# Patient Record
Sex: Male | Born: 1970 | Race: Black or African American | Hispanic: No | Smoking: Current every day smoker
Health system: Southern US, Community
[De-identification: ages and names within clinical notes are randomized; demographics above are authoritative.]

---

## 2014-06-28 ENCOUNTER — Encounter (HOSPITAL_COMMUNITY): Payer: Self-pay | Admitting: *Deleted

## 2014-06-28 ENCOUNTER — Emergency Department (HOSPITAL_COMMUNITY)
Admission: EM | Admit: 2014-06-28 | Discharge: 2014-06-28 | Disposition: A | Discharge status: Home / Self Care | Payer: Self-pay | Attending: Emergency Medicine | Admitting: Emergency Medicine

## 2014-06-28 ENCOUNTER — Emergency Department (HOSPITAL_COMMUNITY): Payer: Self-pay

## 2014-06-28 DIAGNOSIS — Z720 Tobacco use: Secondary | ICD-10-CM | POA: Insufficient documentation

## 2014-06-28 DIAGNOSIS — M79642 Pain in left hand: Secondary | ICD-10-CM | POA: Insufficient documentation

## 2014-06-28 DIAGNOSIS — M791 Myalgia: Secondary | ICD-10-CM | POA: Insufficient documentation

## 2014-06-28 DIAGNOSIS — M7918 Myalgia, other site: Secondary | ICD-10-CM

## 2014-06-28 MED ORDER — MELOXICAM 7.5 MG PO TABS: 7.5000 mg | ORAL_TABLET | Freq: Every day | ORAL | Status: DC

## 2014-06-28 MED ORDER — CYCLOBENZAPRINE HCL 10 MG PO TABS: 10.0000 mg | ORAL_TABLET | Freq: Three times a day (TID) | ORAL | Status: DC | PRN

## 2014-06-28 NOTE — ED Notes (Signed)
Pt reports pain to LT hand with out injury.

## 2014-06-28 NOTE — ED Provider Notes (Signed)
CSN: [id number]     Arrival date & time 06/28/14  [id number] History  This chart was scribed for Clinton DredgeEmily Ahrianna Siglin, PA-C, working with Richardean Canalavid H Yao, MD by Leona CarryG. Clay Sherrill, ED Scribe. [id number] The patient's care was started at 11:34 AM.     Chief Complaint  Patient presents with  . Hand Pain   Patient is a 44 y.o. male presenting with hand pain. The history is provided by the patient. No language interpreter was used.  Hand Pain   HPI Comments: [place] is a 44 y.o. male who presents to the Emergency Department complaining of sharp left hand pain beginning yesterday morning. Patient reports that the pain is localized to his pinkie, thumb, and the bottom of his palm. He states that the pain does not radiate to his arm. He characterizes the pain as an 8/10 in severity and reports that it is exacerbated by flexion  of his hand. He denies any recent trauma or injury to his hand. He reports that he has taken 6 ibuprofen in the past day without relief of his pain. Patient denies numbness or tingling in his fingers. 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 He states that he is right-handed.   Patient does not have a PCP.   History reviewed. No pertinent past medical history. History reviewed. No pertinent past surgical history. History reviewed. No pertinent family history. History  Substance Use Topics  . Smoking status: Current Every Day Smoker -- 0.50 packs/day    Types: Cigarettes  . Smokeless tobacco: Never Used  . Alcohol Use: No    Review of Systems  Constitutional: Negative for fever and chills.  Musculoskeletal: Positive for arthralgias (left hand).  Skin: Negative for color change, pallor, rash and wound.  Allergic/Immunologic: Negative for immunocompromised state.  Neurological: Negative for weakness and numbness.  Hematological: Does not bruise/bleed easily.  Psychiatric/Behavioral: Negative for self-injury.       Allergies  Review of patient's allergies indicates no known allergies.  Home Medications   Prior to Admission medications   Not on File   Triage Vitals: BP 131/78 mmHg  Pulse 99  Temp(Src) 97.9 F (36.6 C) (Oral)  Resp 16  SpO2 98% Physical Exam  Constitutional: He appears well-developed and well-nourished. No distress.  HENT:  Head: Normocephalic and atraumatic.  Neck: Neck supple.  Pulmonary/Chest: Effort normal.  Musculoskeletal:  Left hand with mild diffuse tenderness of the thenar and hypo-thenar eminences. No erythema or warmth. No focal tenderness. Full active ROM of the fingers and wrist. Capillary refill less than 2 seconds. Sensation intact.   Neurological: He is alert.  Skin: He is not diaphoretic.  Nursing note and vitals reviewed.   ED Course  Procedures (including critical care time) DIAGNOSTIC STUDIES: Oxygen Saturation is 98% on room air, normal by my interpretation.    COORDINATION OF CARE: 11:40 AM-Discussed treatment plan which includes a left hand x-ray, Mobic, and Flexiril and with pt at bedside and pt agreed to plan.     Labs Review Labs Reviewed - No data to display  Imaging Review Dg Hand Complete Left  06/28/2014   CLINICAL DATA:  44 year old male with painful volar left hand for 2 days with no known injury. Initial encounter.  EXAM: LEFT HAND - COMPLETE 3+ VIEW  COMPARISON:  None.  FINDINGS: Bone mineralization is within normal limits. Distal radius and ulna intact. Carpal bone alignment within normal limits. Mild  radial carpal joint space loss and subchondral sclerosis. More distal joint spaces appear within normal limits. Metacarpals intact. No acute osseous abnormality identified. No radiopaque foreign body identified. No subcutaneous gas identified.  IMPRESSION: No acute osseous abnormality identified in the left hand.   Electronically Signed   By: Odessa Fleming M.D.   On: 06/28/2014 10:22     EKG Interpretation None      MDM    Final diagnoses:  Musculoskeletal pain  Left hand pain    Afebrile, nontoxic patient with left hand pain throughout musculature of left hand.  Pt does a lot of repetative activities and squeezing motions at work.  Neurovascularly intact.  Doubt specific tendonitis.  Also it does not follow specific dermatome, doubt nerve compression.    D/C home with flexeril, mobic, conservative therapy recommendations.  Hand follow up PRN/worsening symptoms.   Discussed result, findings, treatment, and follow up  with patient.  Pt given return precautions.  Pt verbalizes understanding and agrees with plan.       I personally performed the services described in this documentation, which was scribed in my presence. The recorded information has been reviewed and is accurate.    Clinton Dredge, PA-C 06/28/14 1439  Richardean Canal, MD 06/29/14 [phone]

## 2014-06-28 NOTE — Discharge Instructions (Signed)
Read the information below.  Use the prescribed medication as directed.  Please discuss all new medications with your pharmacist.  You may return to the Emergency Department at any time for worsening condition or any new symptoms that concern you.  If you develop uncontrolled pain, weakness or numbness of the extremity, severe discoloration of the skin, or you are unable to use your hand, return to the ER for a recheck.       Musculoskeletal Pain Musculoskeletal pain is muscle and boney aches and pains. These pains can occur in any part of the body. Your caregiver may treat you without knowing the cause of the pain. They may treat you if blood or urine tests, X-rays, and other tests were normal.  CAUSES There is often not a definite cause or reason for these pains. These pains may be caused by a type of germ (virus). The discomfort may also come from overuse. Overuse includes working out too hard when your body is not fit. Boney aches also come from weather changes. Bone is sensitive to atmospheric pressure changes. HOME CARE INSTRUCTIONS   Ask when your test results will be ready. Make sure you get your test results.  Only take over-the-counter or prescription medicines for pain, discomfort, or fever as directed by your caregiver. If you were given medications for your condition, do not drive, operate machinery or power tools, or sign legal documents for 24 hours. Do not drink alcohol. Do not take sleeping pills or other medications that may interfere with treatment.  Continue all activities unless the activities cause more pain. When the pain lessens, slowly resume normal activities. Gradually increase the intensity and duration of the activities or exercise.  During periods of severe pain, bed rest may be helpful. Lay or sit in any position that is comfortable.  Putting ice on the injured area.  Put ice in a bag.  Place a towel between your skin and the bag.  Leave the ice on for 15 to 20  minutes, 3 to 4 times a day.  Follow up with your caregiver for continued problems and no reason can be found for the pain. If the pain becomes worse or does not go away, it may be necessary to repeat tests or do additional testing. Your caregiver may need to look further for a possible cause. SEEK IMMEDIATE MEDICAL CARE IF:  You have pain that is getting worse and is not relieved by medications.  You develop chest pain that is associated with shortness or breath, sweating, feeling sick to your stomach (nauseous), or throw up (vomit).  Your pain becomes localized to the abdomen.  You develop any new symptoms that seem different or that concern you. MAKE SURE YOU:   Understand these instructions.  Will watch your condition.  Will get help right away if you are not doing well or get worse. Document Released: 04/21/2005 Document Revised: 07/14/2011 Document Reviewed: 12/24/2012 [facility] Patient Information 2015 [place] City[place], [place]. This information is not intended to replace advice given to you by your health care provider. Make sure you discuss any questions you have with your health care provider.

## 2014-09-29 ENCOUNTER — Encounter (HOSPITAL_COMMUNITY): Payer: Self-pay | Admitting: Emergency Medicine

## 2014-09-29 ENCOUNTER — Emergency Department (HOSPITAL_COMMUNITY)
Admission: EM | Admit: 2014-09-29 | Discharge: 2014-09-29 | Disposition: A | Discharge status: Home / Self Care | Payer: Self-pay | Attending: Emergency Medicine | Admitting: Emergency Medicine

## 2014-09-29 DIAGNOSIS — S39012A Strain of muscle, fascia and tendon of lower back, initial encounter: Secondary | ICD-10-CM | POA: Insufficient documentation

## 2014-09-29 DIAGNOSIS — Y999 Unspecified external cause status: Secondary | ICD-10-CM | POA: Insufficient documentation

## 2014-09-29 DIAGNOSIS — Y939 Activity, unspecified: Secondary | ICD-10-CM | POA: Insufficient documentation

## 2014-09-29 DIAGNOSIS — Y929 Unspecified place or not applicable: Secondary | ICD-10-CM | POA: Insufficient documentation

## 2014-09-29 DIAGNOSIS — Z720 Tobacco use: Secondary | ICD-10-CM | POA: Insufficient documentation

## 2014-09-29 DIAGNOSIS — X58XXXA Exposure to other specified factors, initial encounter: Secondary | ICD-10-CM | POA: Insufficient documentation

## 2014-09-29 MED ORDER — METHYLPREDNISOLONE SODIUM SUCC 125 MG IJ SOLR
125.0000 mg | Freq: Once | INTRAMUSCULAR | Status: AC
  Administered 2014-09-29: 125 mg via INTRAMUSCULAR
  Filled 2014-09-29: qty 2

## 2014-09-29 MED ORDER — KETOROLAC TROMETHAMINE 60 MG/2ML IM SOLN
60.0000 mg | Freq: Once | INTRAMUSCULAR | Status: AC
  Administered 2014-09-29: 60 mg via INTRAMUSCULAR
  Filled 2014-09-29: qty 2

## 2014-09-29 MED ORDER — MELOXICAM 7.5 MG PO TABS: 7.5000 mg | ORAL_TABLET | Freq: Every day | ORAL | Status: DC

## 2014-09-29 MED ORDER — CYCLOBENZAPRINE HCL 10 MG PO TABS: 10.0000 mg | ORAL_TABLET | Freq: Three times a day (TID) | ORAL | Status: DC | PRN

## 2014-09-29 NOTE — ED Provider Notes (Signed)
CSN: [id number]     Arrival date & time 09/29/14  [id number] History   First MD Initiated Contact with Patient 09/29/14 [phone]     Chief Complaint  Patient presents with  . Back Pain     (Consider location/radiation/quality/duration/timing/severity/associated sxs/prior Treatment) Patient is a 44 y.o. male presenting with back pain. The history is provided by the patient. No language interpreter was used.  Back Pain Location:  Lumbar spine Quality:  Aching Radiates to:  Does not radiate Pain severity:  Severe Pain is:  Same all the time Onset quality:  Sudden Duration:  2 days Timing:  Constant Progression:  Worsening Chronicity:  New Context: recent illness   Relieved by:  Nothing Worsened by:  Nothing tried Ineffective treatments:  None tried Associated symptoms: no fever   Risk factors: no recent surgery     History reviewed. No pertinent past medical history. History reviewed. No pertinent past surgical history. History reviewed. No pertinent family history. History  Substance Use Topics  . Smoking status: Current Every Day Smoker -- 0.50 packs/day    Types: Cigarettes  . Smokeless tobacco: Never Used  . Alcohol Use: No    Review of Systems  Constitutional: Negative for fever.  Musculoskeletal: Positive for back pain.  All other systems reviewed and are negative.     Allergies  Review of patient's allergies indicates no known allergies.  Home Medications   Prior to Admission medications   Medication Sig Start Date End Date Taking? Authorizing Provider  cyclobenzaprine (FLEXERIL) 10 MG tablet Take 1 tablet (10 mg total) by mouth 3 (three) times daily as needed for muscle spasms (or pain). 09/29/14   Elson AreasLeslie K Filmore Molyneux, PA-C  meloxicam (MOBIC) 7.5 MG tablet Take 1 tablet (7.5 mg total) by mouth daily. 09/29/14   Elson AreasLeslie K Shai Mckenzie, PA-C   BP 132/83 mmHg  Pulse 74  Temp(Src) 98.2 F (36.8 C) (Oral)  Resp 16  Ht 5\' 9"  (1.753 m)  Wt 205 lb (92.987 kg)  BMI 30.26 kg/m2   SpO2 100% Physical Exam  Constitutional: He is oriented to person, place, and time. He appears well-developed and well-nourished.  HENT:  Head: Normocephalic.  Right Ear: External ear normal.  Left Ear: External ear normal.  Nose: Nose normal.  Mouth/Throat: Oropharynx is clear and moist.  Eyes: Conjunctivae and EOM are normal.  Neck: Normal range of motion.  Cardiovascular: Normal rate and normal heart sounds.   Pulmonary/Chest: Effort normal and breath sounds normal.  Abdominal: Soft. He exhibits no distension.  Musculoskeletal: Normal range of motion.  Neurological: He is alert and oriented to person, place, and time.  Skin: Skin is warm.  Psychiatric: He has a normal mood and affect.  Nursing note and vitals reviewed.   ED Course  Procedures (including critical care time) Labs Review Labs Reviewed - No data to display  Imaging Review No results found.   EKG Interpretation None      MDM   Final diagnoses:  Lumbar strain, initial encounter    Solumedrol im torodol im  Rx for meloxicam and flexeril AVS    Elson AreasLeslie K Leeandra Ellerson, PA-C 09/29/14 [id number]  Tomasita CrumbleAdeleke Oni, MD 09/29/14 1752

## 2014-09-29 NOTE — ED Notes (Signed)
Pt reports lower back "spasms" since yesterday; pt denies weakness, numbness, tingling in lower extremities; pt reports "lifting heavy boxes the other day"

## 2014-09-29 NOTE — Discharge Instructions (Signed)
Back Pain, Adult °Low back pain is very common. About 1 in 5 people have back pain. The cause of low back pain is rarely dangerous. The pain often gets better over time. About half of people with a sudden onset of back pain feel better in just 2 weeks. About 8 in 10 people feel better by 6 weeks.  °CAUSES °Some common causes of back pain include: °· Strain of the muscles or ligaments supporting the spine. °· Wear and tear (degeneration) of the spinal discs. °· Arthritis. °· Direct injury to the back. °DIAGNOSIS °Most of the time, the direct cause of low back pain is not known. However, back pain can be treated effectively even when the exact cause of the pain is unknown. Answering your caregiver's questions about your overall health and symptoms is one of the most accurate ways to make sure the cause of your pain is not dangerous. If your caregiver needs more information, he or she may order lab work or imaging tests (X-rays or MRIs). However, even if imaging tests show changes in your back, this usually does not require surgery. °HOME CARE INSTRUCTIONS °For many people, back pain returns. Since low back pain is rarely dangerous, it is often a condition that people can learn to manage on their own.  °· Remain active. It is stressful on the back to sit or stand in one place. Do not sit, drive, or stand in one place for more than 30 minutes at a time. Take short walks on level surfaces as soon as pain allows. Try to increase the length of time you walk each day. °· Do not stay in bed. Resting more than 1 or 2 days can delay your recovery. °· Do not avoid exercise or work. Your body is made to move. It is not dangerous to be active, even though your back may hurt. Your back will likely heal faster if you return to being active before your pain is gone. °· Pay attention to your body when you  bend and lift. Many people have less discomfort when lifting if they bend their knees, keep the load close to their bodies, and  avoid twisting. Often, the most comfortable positions are those that put less stress on your recovering back. °· Find a comfortable position to sleep. Use a firm mattress and lie on your side with your knees slightly bent. If you lie on your back, put a pillow under your knees. °· Only take over-the-counter or prescription medicines as directed by your caregiver. Over-the-counter medicines to reduce pain and inflammation are often the most helpful. Your caregiver may prescribe muscle relaxant drugs. These medicines help dull your pain so you can more quickly return to your normal activities and healthy exercise. °· Put ice on the injured area. °¨ Put ice in a plastic bag. °¨ Place a towel between your skin and the bag. °¨ Leave the ice on for 15-20 minutes, 03-04 times a day for the first 2 to 3 days. After that, ice and heat may be alternated to reduce pain and spasms. °· Ask your caregiver about trying back exercises and gentle massage. This may be of some benefit. °· Avoid feeling anxious or stressed. Stress increases muscle tension and can worsen back pain. It is important to recognize when you are anxious or stressed and learn ways to manage it. Exercise is a great option. °SEEK MEDICAL CARE IF: °· You have pain that is not relieved with rest or medicine. °· You have pain that does not improve in 1 week. °· You have new symptoms. °· You are generally not feeling well. °SEEK   IMMEDIATE MEDICAL CARE IF:  °· You have pain that radiates from your back into your legs. °· You develop new bowel or bladder control problems. °· You have unusual weakness or numbness in your arms or legs. °· You develop nausea or vomiting. °· You develop abdominal pain. °· You feel faint. °Document Released: 04/21/2005 Document Revised: 10/21/2011 Document Reviewed: 08/23/2013 °Exit[employer]® Patient Information ©2015 [employer]. This information is not intended to replace advice given to you by your health care provider. Make sure you  discuss any questions you have with your health care provider. ° °Back Exercises °Back exercises help treat and prevent back injuries. The goal of back exercises is to increase the strength of your abdominal and back muscles and the flexibility of your back. These exercises should be started when you no longer have back pain. Back exercises include: °· Pelvic Tilt. Lie on your back with your knees bent. Tilt your pelvis until the lower part of your back is against the floor. Hold this position 5 to 10 sec and repeat 5 to 10 times. °· Knee to Chest. Pull first 1 knee up against your chest and hold for 20 to 30 seconds, repeat this with the other knee, and then both knees. This may be done with the other leg straight or bent, whichever feels better. °· Sit-Ups or Curl-Ups. Bend your knees 90 degrees. Start with tilting your pelvis, and do a partial, slow sit-up, lifting your trunk only 30 to 45 degrees off the floor. Take at least 2 to 3 seconds for each sit-up. Do not do sit-ups with your knees out straight. If partial sit-ups are difficult, simply do the above but with only tightening your abdominal muscles and holding it as directed. °· Hip-Lift. Lie on your back with your knees flexed 90 degrees. Push down with your feet and shoulders as you raise your hips a couple inches off the floor; hold for 10 seconds, repeat 5 to 10 times. °· Back arches. Lie on your stomach, propping yourself up on bent elbows. Slowly press on your hands, causing an arch in your low back. Repeat 3 to 5 times. Any initial stiffness and discomfort should lessen with repetition over time. °· Shoulder-Lifts. Lie face down with arms beside your body. Keep hips and torso pressed to floor as you slowly lift your head and shoulders off the floor. °Do not overdo your exercises, especially in the beginning. Exercises may cause you some mild back discomfort which lasts for a few minutes; however, if the pain is more severe, or lasts for more than 15  minutes, do not continue exercises until you see your caregiver. Improvement with exercise therapy for back problems is slow.  °See your caregivers for assistance with developing a proper back exercise program. °Document Released: 05/29/2004 Document Revised: 07/14/2011 Document Reviewed: 02/20/2011 °ExitCare® Patient Information ©2015 [employer]. This information is not intended to replace advice given to you by your health care provider. Make sure you discuss any questions you have with your health care provider. ° °

## 2014-10-23 ENCOUNTER — Encounter (HOSPITAL_COMMUNITY): Payer: Self-pay | Admitting: Family Medicine

## 2014-10-23 ENCOUNTER — Emergency Department (HOSPITAL_COMMUNITY)
Admission: EM | Admit: 2014-10-23 | Discharge: 2014-10-23 | Disposition: A | Discharge status: Home / Self Care | Payer: Self-pay | Attending: Emergency Medicine | Admitting: Emergency Medicine

## 2014-10-23 ENCOUNTER — Emergency Department (HOSPITAL_COMMUNITY): Payer: Self-pay

## 2014-10-23 DIAGNOSIS — Y998 Other external cause status: Secondary | ICD-10-CM | POA: Insufficient documentation

## 2014-10-23 DIAGNOSIS — M25559 Pain in unspecified hip: Secondary | ICD-10-CM

## 2014-10-23 DIAGNOSIS — S4991XA Unspecified injury of right shoulder and upper arm, initial encounter: Secondary | ICD-10-CM | POA: Insufficient documentation

## 2014-10-23 DIAGNOSIS — Y92481 Parking lot as the place of occurrence of the external cause: Secondary | ICD-10-CM | POA: Insufficient documentation

## 2014-10-23 DIAGNOSIS — Z720 Tobacco use: Secondary | ICD-10-CM | POA: Insufficient documentation

## 2014-10-23 DIAGNOSIS — S7001XA Contusion of right hip, initial encounter: Secondary | ICD-10-CM | POA: Insufficient documentation

## 2014-10-23 DIAGNOSIS — Y9301 Activity, walking, marching and hiking: Secondary | ICD-10-CM | POA: Insufficient documentation

## 2014-10-23 MED ORDER — HYDROCODONE-ACETAMINOPHEN 5-325 MG PO TABS: 1.0000 | ORAL_TABLET | Freq: Four times a day (QID) | ORAL | Status: AC | PRN

## 2014-10-23 MED ORDER — DICLOFENAC SODIUM 75 MG PO TBEC: 75.0000 mg | DELAYED_RELEASE_TABLET | Freq: Two times a day (BID) | ORAL | Status: DC
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
## 2014-10-23 NOTE — ED Notes (Signed)
Pt transported to xray 
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
## 2014-10-23 NOTE — Progress Notes (Signed)
Orthopedic Tech Progress Note Patient Details:  Clinton Park [birth year] [id number]  Ortho Devices Type of Ortho Device: Arm sling Ortho Device/Splint Location: RUE Ortho Device/Splint Interventions: Ordered, Application   [name] 10/23/2014, 8:29 PM

## 2014-10-23 NOTE — ED Notes (Signed)
Pt returned from xray

## 2014-11-26 ENCOUNTER — Emergency Department (HOSPITAL_COMMUNITY): Payer: Self-pay

## 2014-11-26 ENCOUNTER — Emergency Department (HOSPITAL_COMMUNITY)
Admission: EM | Admit: 2014-11-26 | Discharge: 2014-11-26 | Disposition: A | Discharge status: Home / Self Care | Payer: Self-pay | Attending: Emergency Medicine | Admitting: Emergency Medicine

## 2014-11-26 ENCOUNTER — Encounter (HOSPITAL_COMMUNITY): Payer: Self-pay | Admitting: Emergency Medicine

## 2014-11-26 DIAGNOSIS — M25552 Pain in left hip: Secondary | ICD-10-CM | POA: Insufficient documentation

## 2014-11-26 DIAGNOSIS — M79605 Pain in left leg: Secondary | ICD-10-CM | POA: Insufficient documentation

## 2014-11-26 DIAGNOSIS — Z79899 Other long term (current) drug therapy: Secondary | ICD-10-CM | POA: Insufficient documentation

## 2014-11-26 DIAGNOSIS — Z720 Tobacco use: Secondary | ICD-10-CM | POA: Insufficient documentation

## 2014-11-26 MED ORDER — CYCLOBENZAPRINE HCL 10 MG PO TABS
10.0000 mg | ORAL_TABLET | Freq: Once | ORAL | Status: AC
  Administered 2014-11-26: 10 mg via ORAL
  Filled 2014-11-26: qty 1

## 2014-11-26 MED ORDER — OXYCODONE-ACETAMINOPHEN 5-325 MG PO TABS
1.0000 | ORAL_TABLET | Freq: Once | ORAL | Status: AC
  Administered 2014-11-26: 1 via ORAL

## 2014-11-26 MED ORDER — MELOXICAM 7.5 MG PO TABS: 7.5000 mg | ORAL_TABLET | Freq: Every day | ORAL | Status: AC

## 2014-11-26 MED ORDER — OXYCODONE-ACETAMINOPHEN 5-325 MG PO TABS
ORAL_TABLET | ORAL | Status: AC
  Filled 2014-11-26: qty 1

## 2014-11-26 MED ORDER — METHOCARBAMOL 500 MG PO TABS: 1000.0000 mg | ORAL_TABLET | Freq: Four times a day (QID) | ORAL | Status: AC | PRN

## 2014-11-26 MED ORDER — KETOROLAC TROMETHAMINE 60 MG/2ML IM SOLN
60.0000 mg | Freq: Once | INTRAMUSCULAR | Status: AC
  Administered 2014-11-26: 60 mg via INTRAMUSCULAR
  Filled 2014-11-26: qty 2

## 2014-11-26 NOTE — ED Notes (Signed)
Pt is in stable condition upon d/c and ambulates from ED. 

## 2014-11-26 NOTE — Discharge Instructions (Signed)
Read the information below.  Use the prescribed medication as directed.  Please discuss all new medications with your pharmacist.  You may return to the Emergency Department at any time for worsening condition or any new symptoms that concern you.    If you develop uncontrolled pain, weakness or numbness of the extremity, severe discoloration of the skin, or you are unable to move your hip or walk, return to the ER for a recheck.      Hip Pain Your hip is the joint between your upper legs and your lower pelvis. The bones, cartilage, tendons, and muscles of your hip joint perform a lot of work each day supporting your body weight and allowing you to move around. Hip pain can range from a minor ache to severe pain in one or both of your hips. Pain may be felt on the inside of the hip joint near the groin, or the outside near the buttocks and upper thigh. You may have swelling or stiffness as well.  HOME CARE INSTRUCTIONS   Take medicines only as directed by your health care provider.  Apply ice to the injured area:  Put ice in a plastic bag.  Place a towel between your skin and the bag.  Leave the ice on for 15-20 minutes at a time, 3-4 times a day.  Keep your leg raised (elevated) when possible to lessen swelling.  Avoid activities that cause pain.  Follow specific exercises as directed by your health care provider.  Sleep with a pillow between your legs on your most comfortable side.  Record how often you have hip pain, the location of the pain, and what it feels like. SEEK MEDICAL CARE IF:   You are unable to put weight on your leg.  Your hip is red or swollen or very tender to touch.  Your pain or swelling continues or worsens after 1 week.  You have increasing difficulty walking.  You have a fever. SEEK IMMEDIATE MEDICAL CARE IF:   You have fallen.  You have a sudden increase in pain and swelling in your hip. MAKE SURE YOU:   Understand these instructions.  Will  watch your condition.  Will get help right away if you are not doing well or get worse. Document Released: 10/09/2009 Document Revised: 09/05/2013 Document Reviewed: 12/16/2012 [facility] Patient Information 2015 [place], [place]. This information is not intended to replace advice given to you by your health care provider. Make sure you discuss any questions you have with your health care provider.   Emergency Department Resource Guide 1) Find a Doctor and Pay Out of Pocket Although you won't have to find out who is covered by your insurance plan, it is a good idea to ask around and get recommendations. You will then need to call the office and see if the doctor you have chosen will accept you as a new patient and what types of options they offer for patients who are self-pay. Some doctors offer discounts or will set up payment plans for their patients who do not have insurance, but you will need to ask so you aren't surprised when you get to your appointment.  2) Contact Your Local Health Department Not all health departments have doctors that can see patients for sick visits, but many do, so it is worth a call to see if yours does. If you don't know where your local health department is, you can check in your phone book. The CDC also has a tool to help  you locate your state's health department, and many state websites also have listings of all of their local health departments.  3) Find a Walk-in Clinic If your illness is not likely to be very severe or complicated, you may want to try a walk in clinic. These are popping up all over the country in pharmacies, drugstores, and shopping centers. They're usually staffed by nurse practitioners or physician assistants that have been trained to treat common illnesses and complaints. They're usually fairly quick and inexpensive. However, if you have serious medical issues or chronic medical problems, these are probably not your best option.  No Primary Care  Doctor: - Call Health Connect at  [phone] - they can help you locate a primary care doctor that  accepts your insurance, provides certain services, etc. - Physician Referral Service- [phone]  Chronic Pain Problems: Organization         Address  Phone   Notes  [facility]  [phone] Patients need to be referred by their primary care doctor.   Medication Assistance: Organization         Address  Phone   Notes  [facility] Medication [facility] [address] [place] [place], [place] [address] [phone] --Must be a resident of [facility] -- Must have NO insurance coverage whatsoever (no Medicaid/ Medicare, etc.) -- The pt. MUST have a primary care doctor that directs their care regularly and follows them in the community   MedAssist  [phone]   [employer]  [phone]    Agencies that provide inexpensive medical care: Organization         Address  Phone   Notes  [facility]  [phone]   [facility]    [phone]   [facility] [address] [place], [place] [address] [phone]   [facility] [address] [place], [place] [phone]   Planned Parenthood    [phone]   [facility]    [phone]   [facility]  [address], [place] Phone:  [phone], Fax:  [phone] Hours of Operation:  9 am - 6 pm, M-F.  Also accepts Medicaid/Medicare and self-pay.  [facility]  [address], [place] Phone: [phone], Fax: [phone]. Hours of Operation:  8:30 am - 5:30 pm, M-F.  Also accepts Medicaid and self-pay.  [facility] [place] [address], [place] Phone: [phone]   [facility] [address] Natasha Bence [place], [place] [phone], Ext. [phone] Mondays & Thursdays: 7-9 AM.  First 15 patients are seen on a first  come, first serve basis.    Medicaid-accepting [facility] Providers:  Organization         Address  Phone   Notes  [facility] [address], [place] [phone] Also accepts self-pay patients.  [facility] [address] Laurell Josephs [place], [place]  [phone]   [facility] [address], [place] [phone]   [facility] [address] [place] [phone]   Renaye Rakers [address], [place]   [phone] Only accepts [place] Access [place] patients after they have their name applied to their card.   Self-Pay (no insurance) in [facility]:  Organization  Address  Phone   Notes  Sickle Cell Patients, [facility] Internal [facility] [place] [phone]   [facility] [place] [phone]   [facility] [place]  [address] [place], [place] [phone]   [facility]/Dr. Osei-Bonsu  [address], [place] or [address], [place] [phone] Phone number for both [place] and [place] locations is the same.  Urgent Medical and [facility] [address] [place] [phone]   [facility] ([facility]) [address], [place] or [address] [phone]   [facility] [address] [place] [phone], phone; [phone], fax Sees patients 1st and 3rd Saturday of every month.  Must not qualify for public or private insurance (i.e. Medicaid, Medicare, [place] Health Choice, Veterans' Benefits)  Household income should be no more than 200% of the poverty level The clinic cannot treat you if you are pregnant or think you are pregnant  Sexually transmitted diseases are not treated at the clinic.    Dental Care: Organization          Address  Phone  Notes  [facility] Department of [facility] [place] [phone] Accepts children up to age 57 who are enrolled in [place] or [place]; pregnant women with a Medicaid card; and children who have applied for Medicaid or [place] Health Choice, but were declined, whose parents can pay a reduced fee at time of service.  [facility] Department of [facility]  [address], [place] [phone] Accepts children up to age 67 who are enrolled in [place] or [place]; pregnant women with a Medicaid card; and children who have applied for Medicaid or [place] Health Choice, but were declined, whose parents can pay a reduced fee at time of service.  [facility] [facility] Access PRO[facility]  [place] [phone] Patients are seen by appointment only. Walk-ins are not accepted. [place] will see patients 49 years of age and older. Monday - Tuesday (8am-5pm) Most Wednesdays (8:30-5pm) $30 per visit, cash only  [facility] Adult Dental Access PROGRAM  [address], [facility] [phone] Patients are seen by appointment only. Walk-ins are not accepted. [place] will see patients 38 years of age and older. One Wednesday Evening (Monthly: Volunteer Based).  $30 per visit, cash only  [place]  [phone] for adults; Children under age 59, call Graduate Pediatric Dentistry at [phone]. Children aged 77-14, please call [phone] to request a pediatric application.  Dental services are provided in all areas of dental care including fillings, crowns and bridges, complete and partial dentures, implants, gum treatment, root canals, and extractions. Preventive care is also provided. Treatment is provided to both adults and children. Patients are selected via a lottery and there is often a waiting list.   [facility] [address], [place] Latalia Etzler  [phone] [email or web address]   [facility] [address] [place], [place] [phone], Ext. [phone] Second and Fourth Thursday of each month, opens at 6:30 AM; Clinic ends at 9 AM.  Patients are seen on a first-come first-served basis, and a limited number are seen during each clinic.   [facility]  [address] [place], [place]  [place], [place] [phone]   Eligibility Requirements You must have lived in [place], [place], or [place] counties for at least the last three months.   You cannot be eligible for state or federal sponsored [employer], including [employer], [place], or [employer].   You generally cannot be eligible for healthcare insurance through your employer.    How to apply: Eligibility screenings are held every Tuesday and Wednesday afternoon from 1:00 pm until 4:00 pm. You do not need an appointment for the interview!  [facility] [address], [place], [place]   [place]  [place] Department  [place]  [phone]    Behavioral Health Resources in the Community: Intensive Outpatient Programs Organization         Address  Phone  Notes  [place]. [address], [place], [place] [phone]   [facility] Outpatient [address] [place], [place]   ADS: Alcohol & Drug Svcs [address] [place], [place]   [place] [address]  [place], [place] or [phone]   Substance Abuse Resources Organization         Address  Phone  Notes  Alcohol and Drug Services  [phone]   [place]  [phone]   [facility] [place]   Chinita Pester  [phone]   Residential & Outpatient Substance Abuse Program  [phone]   Psychological  Services Organization         Address  Phone  Notes  [facility] [place]  [place]  [phone]   [place] [address] [place] or [phone]    Mobile Crisis Teams Organization         Address  Phone  Notes  [facility], Mobile Crisis Care Unit  [phone]   [facility]  [address] [place], [place]   [place] [address], [place] [phone]    Self-Help/Support Groups Organization         Address  Phone             Notes  [place]. of [place] - variety of support groups  [place] Call for more information  Narcotics Anonymous (NA), [facility] [address], [employer] [place]  2 meetings at this location   Special educational needs teacher         Address  Phone  Notes  [facility] Residential Treatment [place],    [place]  [phone]   [facility]  [address] [place] 846659, [place], [place]   [place], [place] [phone] Admissions: 8am-3pm M-F  Incentives Substance [place] [address],    [place], [place] [phone]   [facility] [address] Jadene Pierini [place], [place]   [facility] [address],  [place], [place]   [facility]ight Programs - Intensive Outpatient [address] Kristeen [address], [place], [place]   [facility] (Centre.) [place].,  [place], [place] or [phone]   [facility] (RTS) [address], [place], [place] Accepts Medicaid  [facility] [place] [address],  [place] [phone] Substance Abuse/Addiction Treatment   [facility] Resources Organization  Address  Phone  Notes  [facility]  [phone]   Domenic Schwab, PhD [address] Arlis Porta [place], [place]   [phone] or [phone]   [place] [facility] [place], [place] [phone]   [address], [place], [place] [phone] Insurance/Medicaid/sponsorship through [facility] and Families [address] [place]                                    [place], [place] [phone] [place] [address], [place] [phone]    Dr. Adele Schilder  [phone]   [facility] Dept. 1) [address] [place] 2) [place] 3)  [place] [address], [place] [phone]  [phone]   [place] [phone] or [phone] (After Hours)

## 2014-11-26 NOTE — ED Notes (Signed)
Pt sts left sided hip pain with radiation down leg x 2 days; pt sts worse with movement and positioning; pt denies obvious injury

## 2014-11-26 NOTE — ED Notes (Signed)
Patient transported to X-ray 

## 2014-11-26 NOTE — ED Provider Notes (Signed)
CSN: [id number]     Arrival date & time 11/26/14  0865 History   First MD Initiated Contact with Patient 11/26/14 [phone]     Chief Complaint  Patient presents with  . Leg Pain  . Hip Pain     (Consider location/radiation/quality/duration/timing/severity/associated sxs/prior Treatment) The history is provided by the patient.     Pt p/w left hip pain that began last night.  States the pain is sharp and severe, worse with any movement and certain positions.  He did some lunges and exercises he normally doesn't do yesterday but otherwise denies any falls or trauma.  Denies weakness or numbness of the legs.  Denies fevers, chills, abdominal pain, N/V/D, urinary or bowel changes.    History reviewed. No pertinent past medical history. History reviewed. No pertinent past surgical history. History reviewed. No pertinent family history. History  Substance Use Topics  . Smoking status: Current Every Day Smoker -- 0.50 packs/day    Types: Cigarettes  . Smokeless tobacco: Never Used  . Alcohol Use: No    Review of Systems  Constitutional: Negative for fever.  Cardiovascular: Negative for leg swelling.  Gastrointestinal: Negative for nausea, vomiting, abdominal pain and diarrhea.  Genitourinary: Negative for dysuria, urgency, frequency, hematuria and flank pain.  Musculoskeletal: Positive for arthralgias. Negative for back pain.  Skin: Negative for color change, rash and wound.  Allergic/Immunologic: Negative for immunocompromised state.  Neurological: Negative for weakness and numbness.  Psychiatric/Behavioral: Negative for self-injury.      Allergies  Review of patient's allergies indicates no known allergies.  Home Medications   Prior to Admission medications   Medication Sig Start Date End Date Taking? Authorizing Provider  diclofenac (VOLTAREN) 75 MG EC tablet Take 1 tablet (75 mg total) by mouth 2 (two) times daily. 10/23/14   Hope Orlene Och, NP  HYDROcodone-acetaminophen (NORCO)  5-325 MG per tablet Take 1 tablet by mouth every 6 (six) hours as needed for moderate pain. 10/23/14   Hope Orlene Och, NP  ibuprofen (ADVIL,MOTRIN) 200 MG tablet Take 400 mg by mouth every 6 (six) hours as needed for mild pain.    Historical Provider, MD   BP 157/94 mmHg  Pulse 81  Temp(Src) 98.2 F (36.8 C) (Oral)  Resp 18  SpO2 96% Physical Exam  Constitutional: He appears well-developed and well-nourished. No distress.  HENT:  Head: Normocephalic and atraumatic.  Neck: Neck supple.  Pulmonary/Chest: Effort normal.  Abdominal: Soft. He exhibits no distension. There is no tenderness. There is no rebound and no guarding.  Musculoskeletal:  Spine nontender, no crepitus, or stepoffs. Lower extremities:  Strength 5/5, sensation intact, distal pulses intact.    Diffuse tenderness over left hip lateral, posterior without erythema, edema, warmth.  Pain is passive ROM.    Neurological: He is alert.  Skin: He is not diaphoretic.  Nursing note and vitals reviewed.   ED Course  Procedures (including critical care time) Labs Review Labs Reviewed - No data to display  Imaging Review Dg Hip Unilat With Pelvis 2-3 Views Left  11/26/2014   CLINICAL DATA:  Left hip pain radiating into the left leg for 2 days. No known injury. Initial encounter.  EXAM: DG HIP (WITH OR WITHOUT PELVIS) 2-3V LEFT  COMPARISON:  None.  FINDINGS: There is no evidence of hip fracture or dislocation. There is no evidence of arthropathy or other focal bone abnormality.  IMPRESSION: Negative exam.   Electronically Signed   By: Drusilla Kanner M.D.   On: 11/26/2014 09:14  EKG Interpretation None      MDM   Final diagnoses:  Left hip pain   Afebrile, nontoxic patient with left hip pain with tenderness to palpation and pain with ROM.  No clinical e/o septic joint.  Neurovascularly intact.  Xray negative.   D/C home with mobic, robaxin, PCP follow up.  Discussed result, findings, treatment, and follow up  with  patient.  Pt given return precautions.  Pt verbalizes understanding and agrees with plan.         Trixie Dredge, PA-C 11/26/14 1128  Richardean Canal, MD 11/27/14 [phone]

## 2017-01-19 IMAGING — DX DG HIP (WITH OR WITHOUT PELVIS) 2-3V*L*
3 series · 3 of 3 positions shown · non-contrast
Comparison: None.

CLINICAL DATA: Left hip pain radiating into the left leg for 2
days. No known injury. Initial encounter.

EXAM:
DG HIP (WITH OR WITHOUT PELVIS) 2-3V LEFT

[pelvis ap]
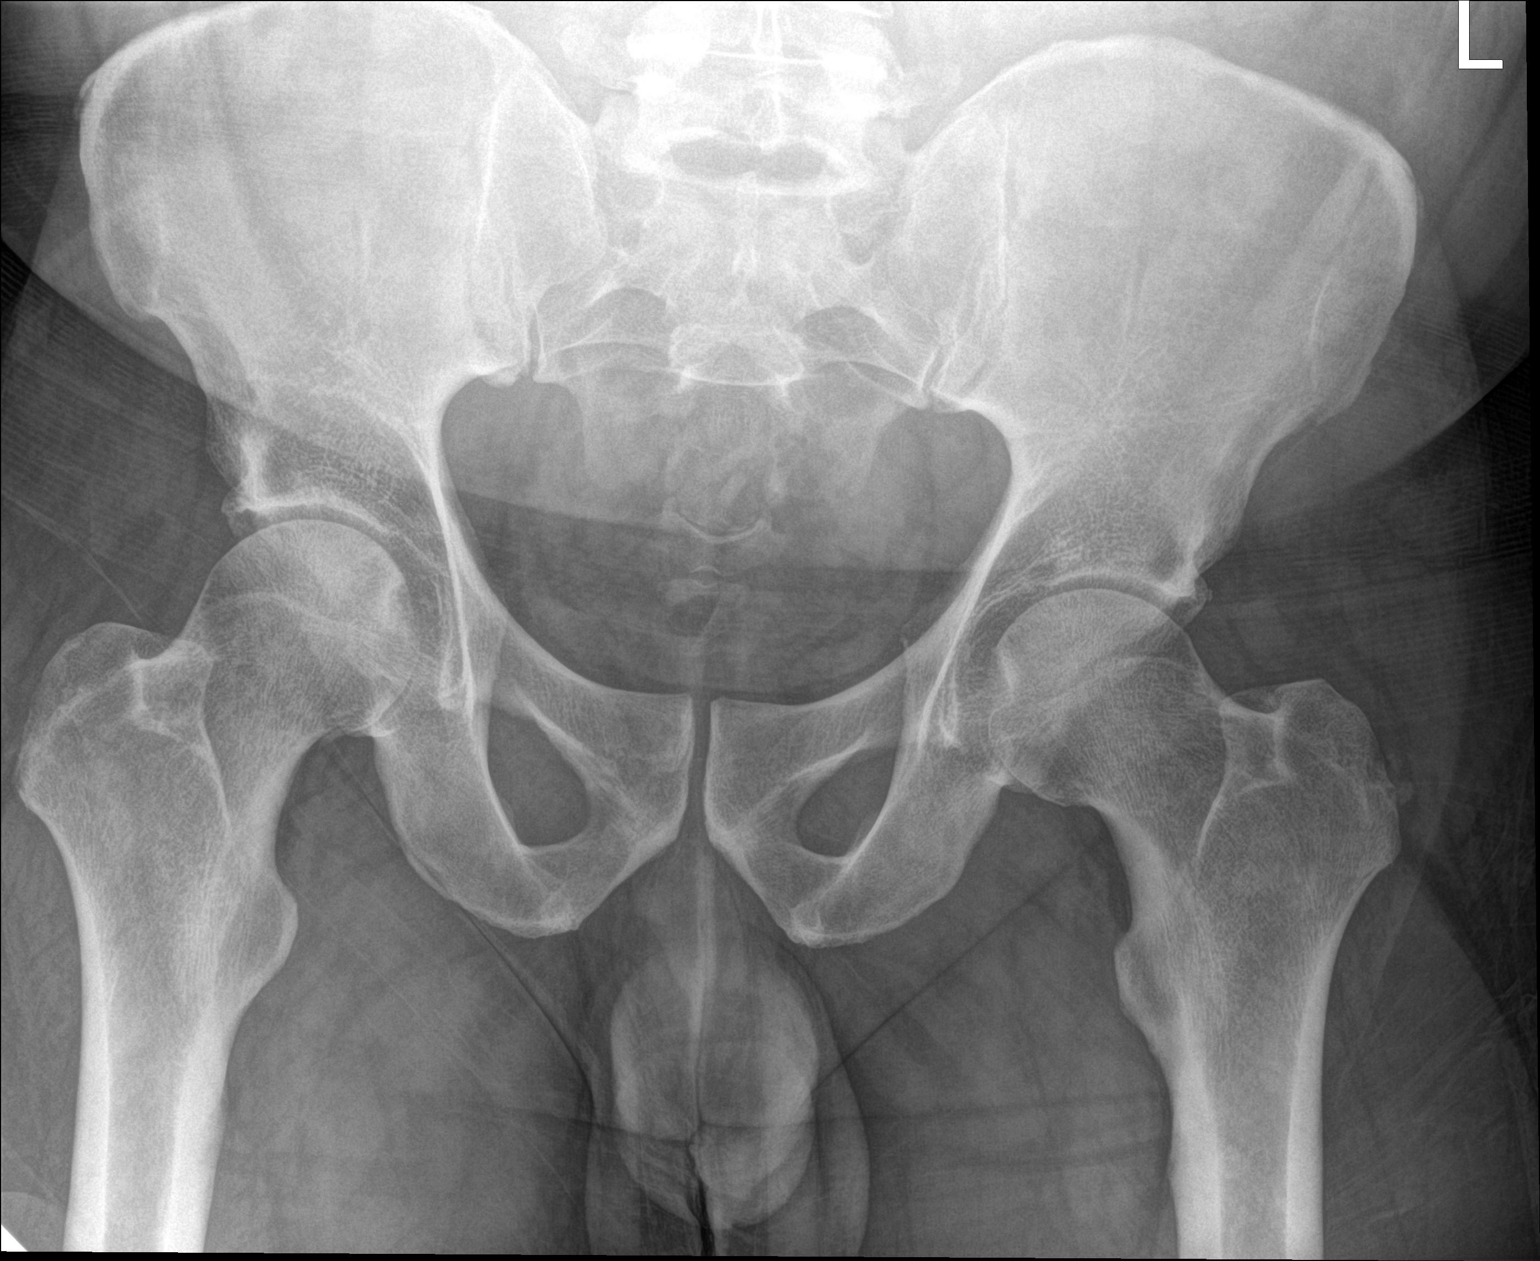

[hip ap]
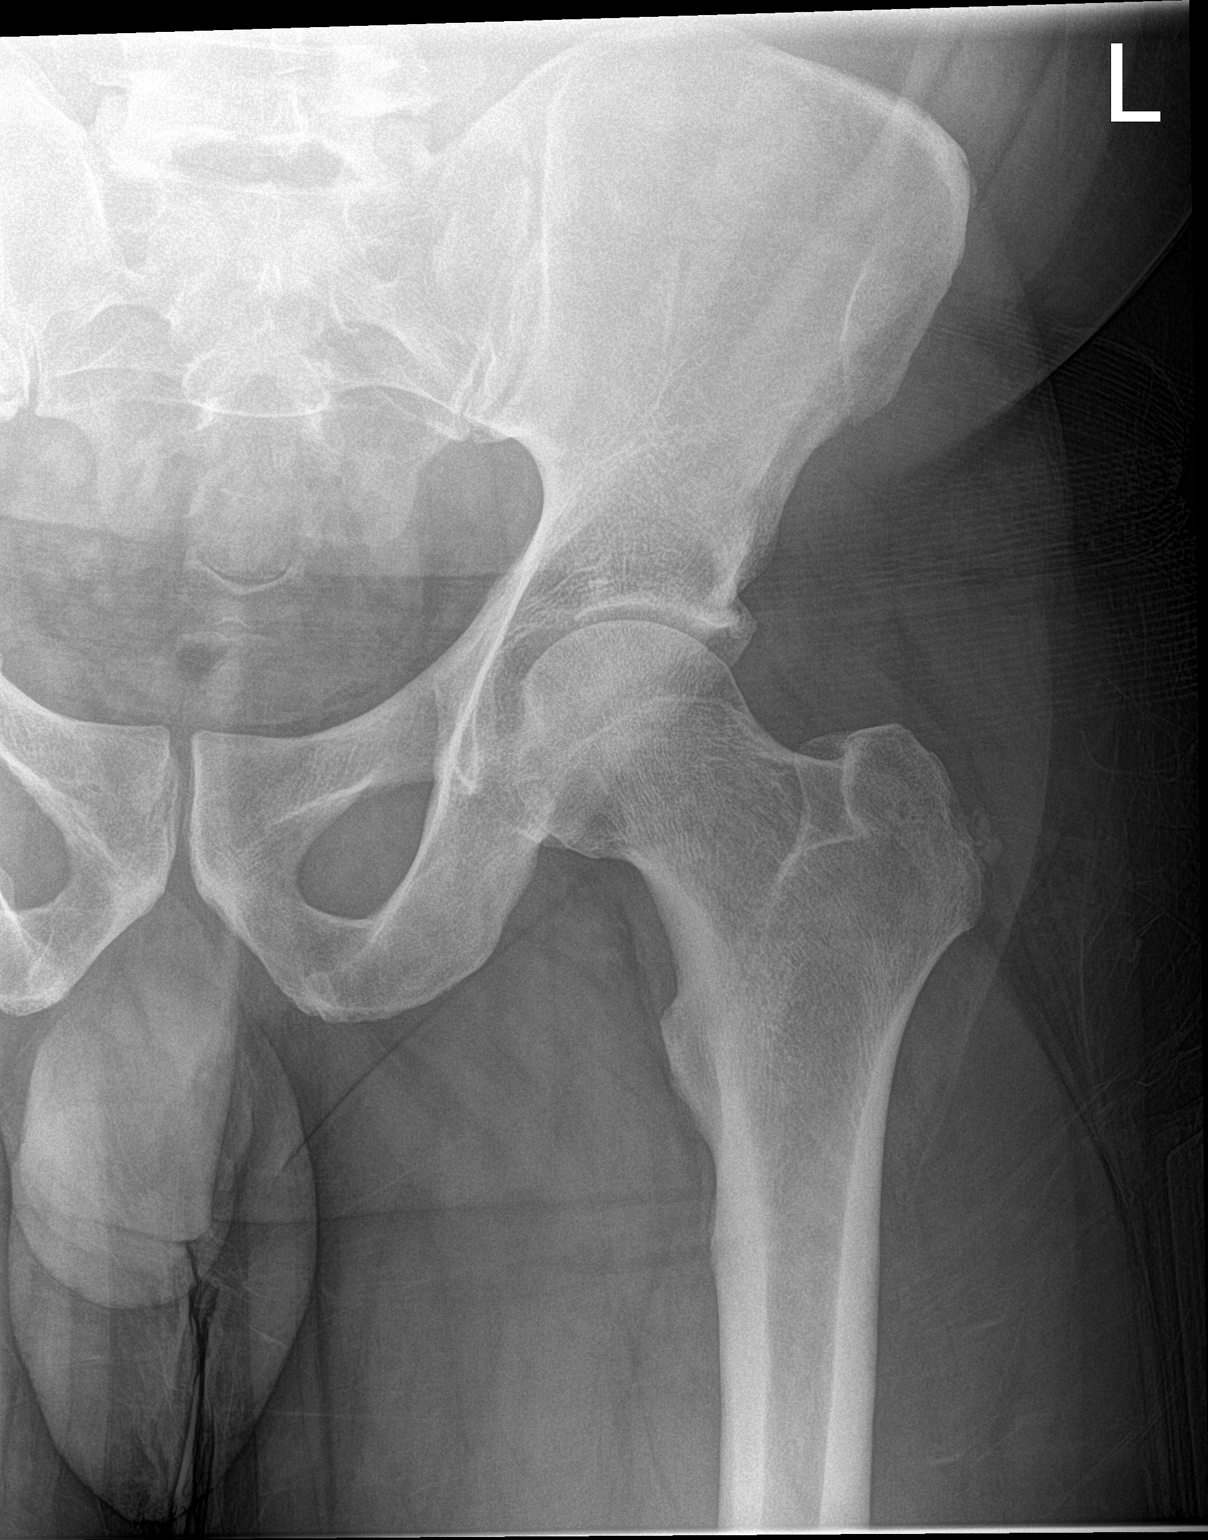

[hip lat]
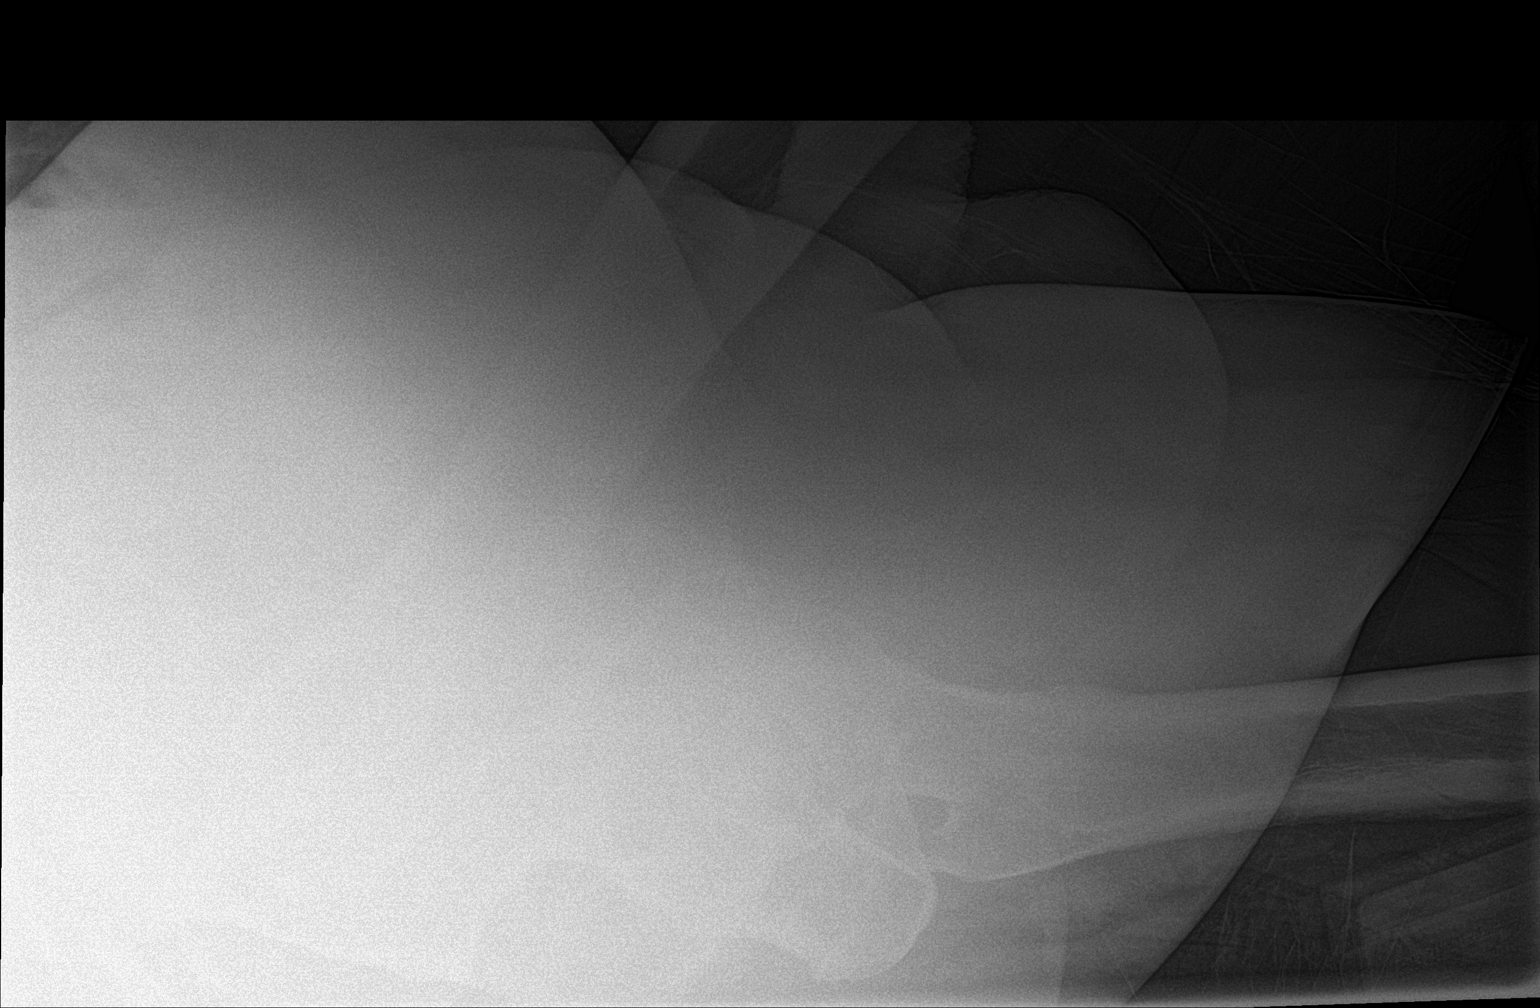

[3 of 3 positions shown; findings below may reference images not displayed]

FINDINGS: There is no evidence of hip fracture or dislocation. There is no
evidence of arthropathy or other focal bone abnormality.
IMPRESSION: Negative exam.
